# Patient Record
Sex: Male | Born: 1985 | Race: White | Hispanic: No | Marital: Married | Smoking: Current every day smoker
Health system: Southern US, Community
[De-identification: ages and names within clinical notes are randomized; demographics above are authoritative.]

## PROBLEM LIST (undated history)

## (undated) HISTORY — PX: WISDOM TOOTH EXTRACTION: SHX21

---
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
## 2005-03-28 ENCOUNTER — Emergency Department: Payer: Self-pay | Admitting: Emergency Medicine

## 2006-08-22 ENCOUNTER — Emergency Department: Payer: Self-pay | Admitting: Emergency Medicine

## 2011-07-23 ENCOUNTER — Emergency Department: Payer: Self-pay | Admitting: Emergency Medicine

## 2014-05-09 ENCOUNTER — Emergency Department: Payer: Self-pay | Admitting: Emergency Medicine

## 2017-10-01 ENCOUNTER — Emergency Department: Payer: Self-pay

## 2017-10-01 ENCOUNTER — Emergency Department
Admission: EM | Admit: 2017-10-01 | Discharge: 2017-10-01 | Disposition: A | Discharge status: Home / Self Care | Payer: Self-pay | Attending: Emergency Medicine | Admitting: Emergency Medicine

## 2017-10-01 DIAGNOSIS — R1031 Right lower quadrant pain: Secondary | ICD-10-CM | POA: Insufficient documentation

## 2017-10-01 DIAGNOSIS — F17200 Nicotine dependence, unspecified, uncomplicated: Secondary | ICD-10-CM | POA: Insufficient documentation

## 2017-10-01 LAB — CBC
HEMATOCRIT: 46.2 % (ref 40.0–52.0)
HEMOGLOBIN: 16.3 g/dL (ref 13.0–18.0)
MCH: 31.1 pg (ref 26.0–34.0)
MCHC: 35.3 g/dL (ref 32.0–36.0)
MCV: 87.9 fL (ref 80.0–100.0)
Platelets: 285 10*3/uL (ref 150–440)
RBC: 5.26 MIL/uL (ref 4.40–5.90)
RDW: 12.9 % (ref 11.5–14.5)
WBC: 10.6 10*3/uL (ref 3.8–10.6)

## 2017-10-01 LAB — COMPREHENSIVE METABOLIC PANEL
ALBUMIN: 4.3 g/dL (ref 3.5–5.0)
ALT: 30 U/L (ref 17–63)
AST: 36 U/L (ref 15–41)
Alkaline Phosphatase: 100 U/L (ref 38–126)
Anion gap: 11 (ref 5–15)
BUN: 14 mg/dL (ref 6–20)
CHLORIDE: 101 mmol/L (ref 101–111)
CO2: 26 mmol/L (ref 22–32)
CREATININE: 1.17 mg/dL (ref 0.61–1.24)
Calcium: 9.3 mg/dL (ref 8.9–10.3)
GFR calc Af Amer: >60 mL/min (ref >60)
GLUCOSE: 70 mg/dL (ref 65–99)
POTASSIUM: 4.2 mmol/L (ref 3.5–5.1)
Sodium: 138 mmol/L (ref 135–145)
Total Bilirubin: 0.4 mg/dL (ref 0.3–1.2)
Total Protein: 7.6 g/dL (ref 6.5–8.1)

## 2017-10-01 LAB — URINALYSIS, COMPLETE (UACMP) WITH MICROSCOPIC
BACTERIA UA: NONE SEEN
BILIRUBIN URINE: NEGATIVE
Glucose, UA: NEGATIVE mg/dL
KETONES UR: NEGATIVE mg/dL
LEUKOCYTES UA: NEGATIVE
NITRITE: NEGATIVE
PH: 5 (ref 5.0–8.0)
Protein, ur: NEGATIVE mg/dL
SPECIFIC GRAVITY, URINE: 1.015 (ref 1.005–1.030)
Squamous Epithelial / LPF: NONE SEEN

## 2017-10-01 LAB — LIPASE, BLOOD: LIPASE: 40 U/L (ref 11–51)

## 2017-10-01 MED ORDER — SODIUM CHLORIDE 0.9 % IV BOLUS (SEPSIS)
1000.0000 mL | Freq: Once | INTRAVENOUS | Status: AC
  Administered 2017-10-01: 1000 mL via INTRAVENOUS

## 2017-10-01 MED ORDER — IOPAMIDOL (ISOVUE-300) INJECTION 61%
30.0000 mL | Freq: Once | INTRAVENOUS | Status: AC | PRN
  Administered 2017-10-01: 30 mL via ORAL

## 2017-10-01 MED ORDER — IOPAMIDOL (ISOVUE-300) INJECTION 61%
100.0000 mL | Freq: Once | INTRAVENOUS | Status: AC | PRN
  Administered 2017-10-01: 100 mL via INTRAVENOUS

## 2017-10-01 NOTE — ED Triage Notes (Signed)
Patient c/o lower right abdominal pain beginning yesterday. Patient states: "I feel like there's a knot in my stomach". Patient reports he was lifting heavy objects yesterday. Patient points to right inguinal area for area of pain.

## 2017-10-01 NOTE — ED Notes (Signed)
Back in room s/p CT scan.

## 2017-10-01 NOTE — ED Provider Notes (Signed)
[facility] Emergency Department Provider Note  ____________________________________________   First MD Initiated Contact with Patient 10/01/17 2038     (approximate)  I have reviewed the triage vital signs and the nursing notes.   HISTORY  Chief Complaint Abdominal Pain   HPI Brian Moyer is a 31 y.o. male without any chronic medical problems was complaining of approximately 5 hours of right lower quadrant abdominal pain.  He says the pain is a 7 out of 10 and cramping in quality.  Is worse with movement.  He says that he lifts heavily at work but has done the same job over the past 2 years.  He has not increasing his lifting or activity recently.  He denies any nausea vomiting or diarrhea.  Denies any drinking or drug use.  Denies having an appendectomy.  Does not report any radiation of the pain.  Says that he feels "I am not" in the right lower quadrant region of the pain.    History reviewed. No pertinent past medical history.  There are no active problems to display for this patient.   Past Surgical History:  Procedure Laterality Date  . WISDOM TOOTH EXTRACTION      Prior to Admission medications   Not on File    Allergies Patient has no known allergies.  No family history on file.  Social History Social History   Tobacco Use  . Smoking status: Current Every Day Smoker  . Smokeless tobacco: Never Used  Substance Use Topics  . Alcohol use: Yes    Comment: occassional  . Drug use: No    Review of Systems  Constitutional: No fever/chills Eyes: No visual changes. ENT: No sore throat. Cardiovascular: Denies chest pain. Respiratory: Denies shortness of breath. Gastrointestinal:  No nausea, no vomiting.  No diarrhea.  No constipation. Genitourinary: Negative for dysuria. Musculoskeletal: Negative for back pain. Skin: Negative for rash. Neurological: Negative for headaches, focal weakness or  numbness.   ____________________________________________   PHYSICAL EXAM:  VITAL SIGNS: ED Triage Vitals [10/01/17 2013]  Enc Vitals Group     BP (!) 151/83     Pulse Rate 97     Resp 18     Temp 98.3 F (36.8 C)     Temp Source Oral     SpO2 99 %     Weight 220 lb (99.8 kg)     Height 6\' 1"  (1.854 m)     Head Circumference      Peak Flow      Pain Score 7     Pain Loc      Pain Edu?      Excl. in GC?     Constitutional: Alert and oriented. Well appearing and in no acute distress. Eyes: Conjunctivae are normal.  Head: Atraumatic. Nose: No congestion/rhinnorhea. Mouth/Throat: Mucous membranes are moist.  Neck: No stridor.   Cardiovascular: Normal rate, regular rhythm. Grossly normal heart sounds.   Respiratory: Normal respiratory effort.  No retractions. Lungs CTAB. Gastrointestinal: Soft with moderate right lower quadrant tenderness to palpation over McBurney's point without rebound or guarding.  There is no hernia mass palpated neither when the patient is lying down or when he is standing. No distention.  Musculoskeletal: No lower extremity tenderness nor edema.  No joint effusions. Neurologic:  Normal speech and language. No gross focal neurologic deficits are appreciated. Skin:  Skin is warm, dry and intact. No rash noted. Psychiatric: Mood and affect are normal. Speech and behavior are normal.  ____________________________________________   LABS (all labs ordered are listed, but only abnormal results are displayed)  Labs Reviewed  URINALYSIS, COMPLETE (UACMP) WITH MICROSCOPIC - Abnormal; Notable for the following components:      Result Value   Color, Urine YELLOW (*)    APPearance CLEAR (*)    Hgb urine dipstick SMALL (*)    All other components within normal limits  LIPASE, BLOOD  COMPREHENSIVE METABOLIC PANEL  CBC   ____________________________________________  EKG  Thickened appearance of the distal colon likely chronic.  Mild colitis not entirely  excluded.  No bowel obstruction and a normal appendix found.  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 ____________________________________________  RADIOLOGY   ____________________________________________   PROCEDURES  Procedure(s) performed:   Procedures  Critical Care performed:   ____________________________________________   INITIAL IMPRESSION / ASSESSMENT AND PLAN / ED COURSE  Pertinent labs & imaging results that were available during my care of the patient were reviewed by me and considered in my medical decision making (see chart for details).  Differential diagnosis includes, but is not limited to, acute appendicitis, renal colic, testicular torsion, urinary tract infection/pyelonephritis, prostatitis,  epididymitis, diverticulitis, small bowel obstruction or ileus, colitis, abdominal aortic aneurysm, gastroenteritis, hernia, etc.  As part of my medical decision making, I reviewed the following data within the electronic MEDICAL RECORD NUMBER Old chart reviewed     ----------------------------------------- 11:20 PM on 10/01/2017 -----------------------------------------  Patient at this time continues without any nausea vomiting or diarrhea.  No worsening of his pain.  Discussed the CAT scan results with the patient.  He will be discharged to follow-up with primary care at this time.  Discussed return precautions such as any worsening or concerning symptoms.  However, I feel that this is most likely related to abdominal wall pain given the patient's lab results as well as imaging.  Patient will return for any worsening or concerning symptoms especially worsening pain, nausea vomiting, diarrhea or fever.  Otherwise, he will try topical salve such as Aspercreme or icy hot and ibuprofen or Advil p.o.  He is understanding of this plan willing to  comply.  ____________________________________________   FINAL CLINICAL IMPRESSION(S) / ED DIAGNOSES  Right lower quadrant abdominal pain.    NEW MEDICATIONS STARTED DURING THIS VISIT:  This SmartLink is deprecated. Use AVSMEDLIST instead to display the medication list for a patient.   Note:  This document was prepared using Dragon voice recognition software and may include unintentional dictation errors.     Myrna BlazerSchaevitz, Arul Farabee Matthew, MD 10/01/17 [id number]

## 2017-10-01 NOTE — ED Notes (Signed)
Reviewed discharge instructions, and follow-up care with patient. Patient verbalized understanding of all information reviewed. Patient stable, with no distress noted at this time.    

## 2017-10-01 NOTE — ED Notes (Signed)
Patient transported to CT 

## 2018-03-11 ENCOUNTER — Encounter: Payer: Self-pay | Admitting: Emergency Medicine

## 2018-03-11 ENCOUNTER — Emergency Department
Admission: EM | Admit: 2018-03-11 | Discharge: 2018-03-11 | Disposition: A | Discharge status: Home / Self Care | Payer: Commercial Managed Care - PPO | Attending: Emergency Medicine | Admitting: Emergency Medicine

## 2018-03-11 ENCOUNTER — Emergency Department: Payer: Commercial Managed Care - PPO

## 2018-03-11 DIAGNOSIS — S93402A Sprain of unspecified ligament of left ankle, initial encounter: Secondary | ICD-10-CM | POA: Diagnosis not present

## 2018-03-11 DIAGNOSIS — Y9364 Activity, baseball: Secondary | ICD-10-CM | POA: Insufficient documentation

## 2018-03-11 DIAGNOSIS — X509XXA Other and unspecified overexertion or strenuous movements or postures, initial encounter: Secondary | ICD-10-CM | POA: Diagnosis not present

## 2018-03-11 DIAGNOSIS — Y92320 Baseball field as the place of occurrence of the external cause: Secondary | ICD-10-CM | POA: Insufficient documentation

## 2018-03-11 DIAGNOSIS — S99912A Unspecified injury of left ankle, initial encounter: Secondary | ICD-10-CM | POA: Diagnosis present

## 2018-03-11 DIAGNOSIS — Y998 Other external cause status: Secondary | ICD-10-CM | POA: Insufficient documentation

## 2018-03-11 DIAGNOSIS — F17200 Nicotine dependence, unspecified, uncomplicated: Secondary | ICD-10-CM | POA: Insufficient documentation

## 2018-03-11 MED ORDER — IBUPROFEN 800 MG PO TABS: 800.0000 mg | ORAL_TABLET | Freq: Three times a day (TID) | ORAL | 0 refills | Status: AC | PRN

## 2018-03-11 MED ORDER — OXYCODONE-ACETAMINOPHEN 5-325 MG PO TABS
1.0000 | ORAL_TABLET | Freq: Once | ORAL | Status: AC
  Administered 2018-03-11: 1 via ORAL
  Filled 2018-03-11: qty 1

## 2018-03-11 MED ORDER — OXYCODONE-ACETAMINOPHEN 5-325 MG PO TABS: 1.0000 | ORAL_TABLET | ORAL | 0 refills | Status: AC | PRN

## 2018-03-11 NOTE — ED Triage Notes (Signed)
Patient presents to the ED with left ankle pain since last night.  Patient was playing softball and slid.  Patient is in no obvious distress at this time.  Patient states he can put very little weight on left ankle.  Ankle appears swollen.

## 2018-03-11 NOTE — ED Notes (Signed)

## 2018-03-11 NOTE — ED Provider Notes (Signed)
[facility] Emergency Department Provider Note  ____________________________________________  Time seen: Approximately 5:41 PM  I have reviewed the triage vital signs and the nursing notes.   HISTORY  Chief Complaint Ankle Pain    HPI Brian Moyer is a 32 y.o. male that presents to emergency department for evaluation of left ankle pain after injury during softball last night.  Patient collided with another player.  His ankle twisted inward.  He has had difficulty walking since last night.  He has taken ibuprofen for pain and swelling.  No numbness, tingling.   History reviewed. No pertinent past medical history.  There are no active problems to display for this patient.   Past Surgical History:  Procedure Laterality Date  . WISDOM TOOTH EXTRACTION      Prior to Admission medications   Medication Sig Start Date End Date Taking? Authorizing Provider  ibuprofen (ADVIL,MOTRIN) 800 MG tablet Take 1 tablet (800 mg total) by mouth every 8 (eight) hours as needed. 03/11/18   Enid Derry, PA-C  oxyCODONE-acetaminophen (PERCOCET) 5-325 MG tablet Take 1 tablet by mouth every 4 (four) hours as needed for severe pain. 03/11/18 03/11/19  Enid Derry, PA-C    Allergies Patient has no known allergies.  No family history on file.  Social History Social History   Tobacco Use  . Smoking status: Current Every Day Smoker  . Smokeless tobacco: Never Used  Substance Use Topics  . Alcohol use: Yes    Comment: occassional  . Drug use: No     Review of Systems  Cardiovascular: No chest pain. Respiratory: No SOB. Gastrointestinal: No nausea, no vomiting.  Musculoskeletal: Positive for ankle pain. Skin: Negative for rash, abrasions, lacerations.  Positive for ecchymosis. Neurological: Negative for numbness or tingling   ____________________________________________   PHYSICAL EXAM:  VITAL SIGNS: ED Triage Vitals  Enc Vitals Group     BP 03/11/18 1649  (!) 133/92     Pulse Rate 03/11/18 1649 (!) 103     Resp 03/11/18 1649 16     Temp 03/11/18 1649 98.9 F (37.2 C)     Temp Source 03/11/18 1649 Oral     SpO2 03/11/18 1649 98 %     Weight 03/11/18 1649 220 lb (99.8 kg)     Height 03/11/18 1649  (1.854 m)     Head Circumference --      Peak Flow --      Pain Score 03/11/18 1657 10     Pain Loc --      Pain Edu? --      Excl. in GC? --      Constitutional: Alert and oriented. Well appearing and in no acute distress. Eyes: Conjunctivae are normal. PERRL. EOMI. Head: Atraumatic. ENT:      Ears:      Nose: No congestion/rhinnorhea.      Mouth/Throat: Mucous membranes are moist.  Neck: No stridor.   Cardiovascular: Normal rate, regular rhythm.  Good peripheral circulation.  Symmetric dorsalis pedis pulses bilaterally. Respiratory: Normal respiratory effort without tachypnea or retractions. Lungs CTAB. Good air entry to the bases with no decreased or absent breath sounds. Musculoskeletal:  No gross deformities appreciated.  Moderate swelling and ecchymosis to lateral left malleolus. Neurologic:  Normal speech and language. No gross focal neurologic deficits are appreciated.  Skin:  Skin is warm, dry and intact. No rash noted. Psychiatric: Mood and affect are normal. Speech and behavior are normal. Patient exhibits appropriate insight and judgement.  ____________________________________________   LABS (all labs ordered are listed, but only abnormal results are displayed)  Labs Reviewed - No data to display ____________________________________________  EKG   ____________________________________________  RADIOLOGY Lexine Baton, personally viewed and evaluated these images (plain radiographs) as part of my medical decision making, as well as reviewing the written report by the radiologist.  Dg Ankle Complete Left  Result Date: 03/11/2018 CLINICAL DATA:  Pain, trauma, injury playing softball EXAM: LEFT ANKLE COMPLETE  - 3+ VIEW COMPARISON:  None. FINDINGS: Lateral soft tissue swelling. No fracture, subluxation or dislocation. Soft tissues are intact. IMPRESSION: Lateral soft tissue swelling.  No acute bony abnormality. Electronically Signed   By: Charlett Nose M.D.   On: 03/11/2018 17:21    ____________________________________________    PROCEDURES  Procedure(s) performed:    Procedures    Medications  oxyCODONE-acetaminophen (PERCOCET/ROXICET) 5-325 MG per tablet 1 tablet (1 tablet Oral Given 03/11/18 1756)     ____________________________________________   INITIAL IMPRESSION / ASSESSMENT AND PLAN / ED COURSE  Pertinent labs & imaging results that were available during my care of the patient were reviewed by me and considered in my medical decision making (see chart for details).  Review of the [place] CSRS was performed in accordance of the [facility] prior to dispensing any controlled drugs.     Patient's diagnosis is consistent with ankle sprain.  Vital signs and exam are reassuring.  X-ray negative for fracture.  Ankle splint was placed.  Crutches were given.  Patient will be discharged home with prescriptions for Percocet and ibuprofen. Patient is to follow up with orthopedics as directed. Patient is given ED precautions to return to the ED for any worsening or new symptoms.     ____________________________________________  FINAL CLINICAL IMPRESSION(S) / ED DIAGNOSES  Final diagnoses:  Sprain of left ankle, unspecified ligament, initial encounter      NEW MEDICATIONS STARTED DURING THIS VISIT:  ED Discharge Orders        Ordered    oxyCODONE-acetaminophen (PERCOCET) 5-325 MG tablet  Every 4 hours PRN     03/11/18 1808    ibuprofen (ADVIL,MOTRIN) 800 MG tablet  Every 8 hours PRN     03/11/18 1808          This chart was dictated using voice recognition software/Dragon. Despite best efforts to proofread, errors can occur which can change the meaning. Any change was purely  unintentional.    Enid Derry, PA-C 03/11/18 1823    Dionne Bucy, MD 03/11/18 [phone]

## 2018-12-06 IMAGING — CT CT ABD-PELV W/ CM
2 of 4 series · 16 of 46 positions shown, 18 images · IV contrast (APPLIED)
Comparison: None.

CLINICAL DATA: 31-year-old male with abdominal pain. Concern for
acute appendicitis.

EXAM:
CT ABDOMEN AND PELVIS WITH CONTRAST
TECHNIQUE: Multidetector CT imaging of the abdomen and pelvis was performed
using the standard protocol following bolus administration of
intravenous contrast.
CONTRAST:  100mL B9IM2T-I33 IOPAMIDOL (B9IM2T-I33) INJECTION 61%

[Series 2: routine abd/pel with · axial · 0.87mm/px · z∈[-964,-484]mm · 13 of 106 slices shown, 15 images]
[im 5/106  soft-tissue]
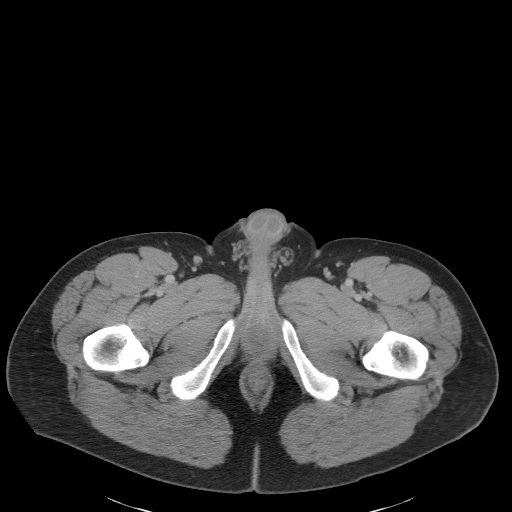
[im 5/106  bone]
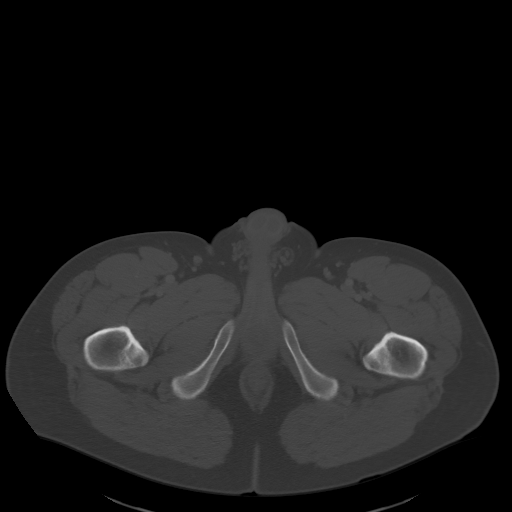
[im 14/106  soft-tissue]
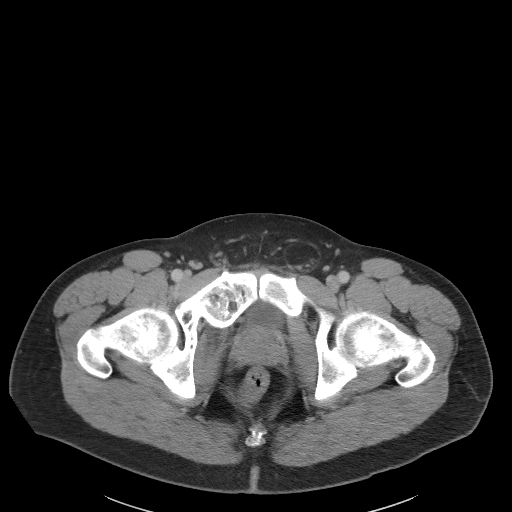
[im 23/106  soft-tissue]
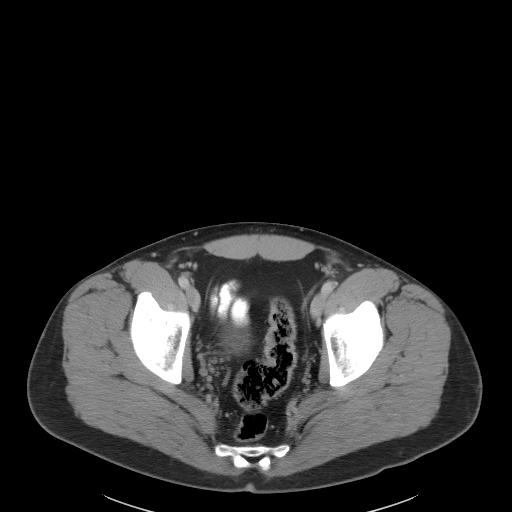
[im 28/106  soft-tissue]
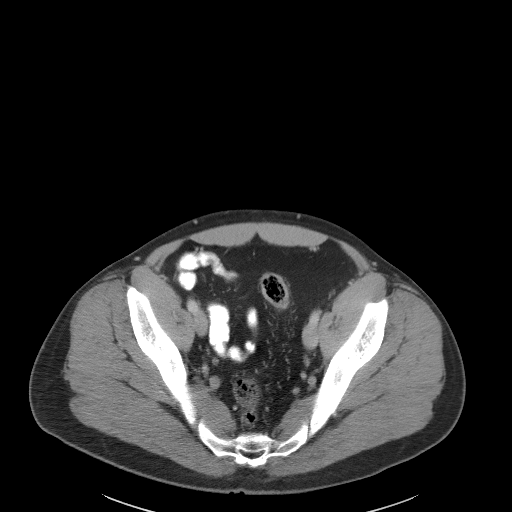
[im 37/106  soft-tissue]
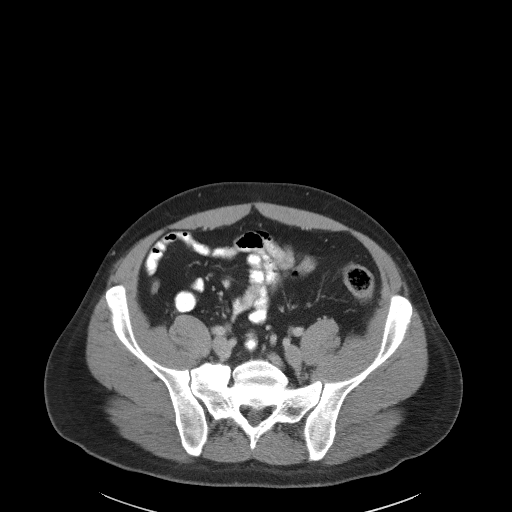
[im 46/106  soft-tissue]
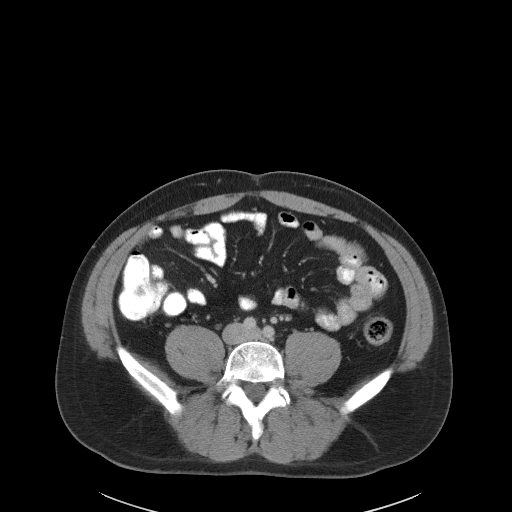
[im 55/106  soft-tissue]
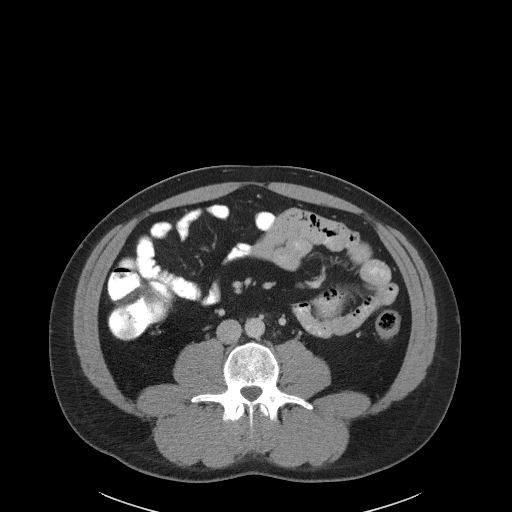
[im 60/106  soft-tissue]
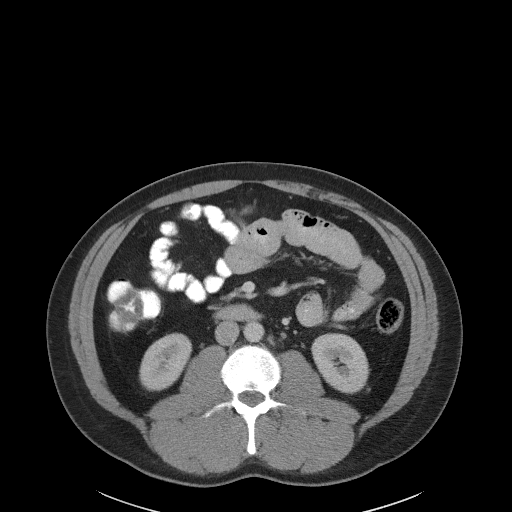
[im 69/106  soft-tissue]
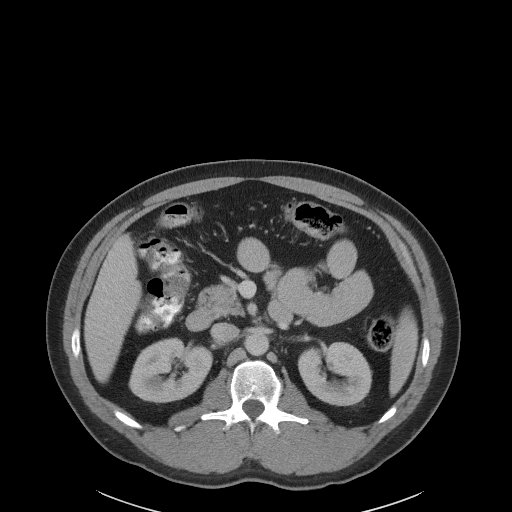
[im 69/106  bone]
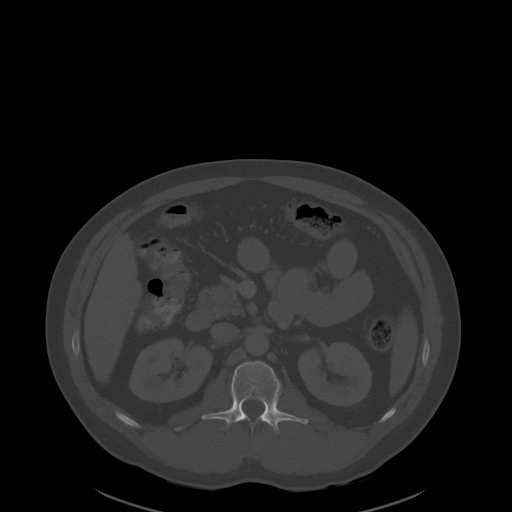
[im 78/106  soft-tissue]
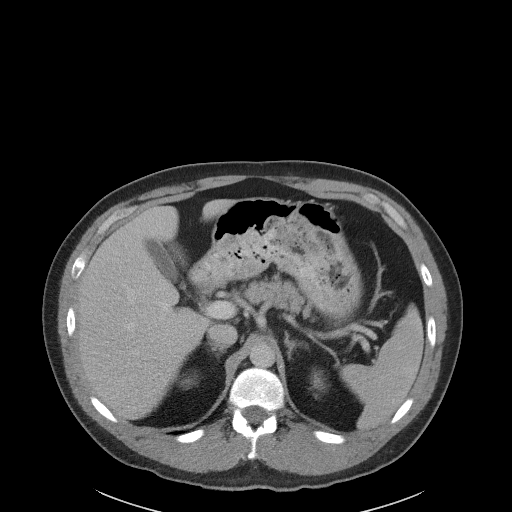
[im 83/106  soft-tissue]
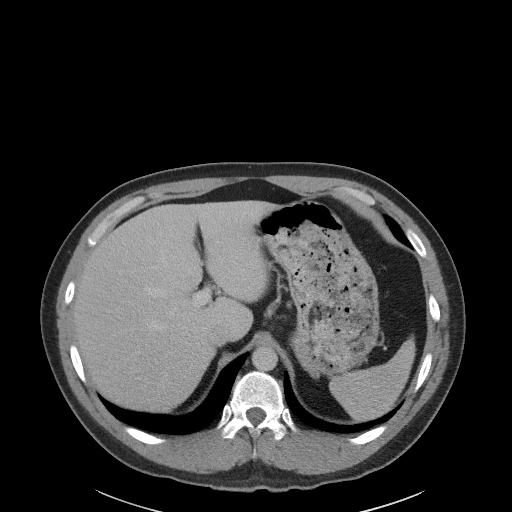
[im 92/106  soft-tissue]
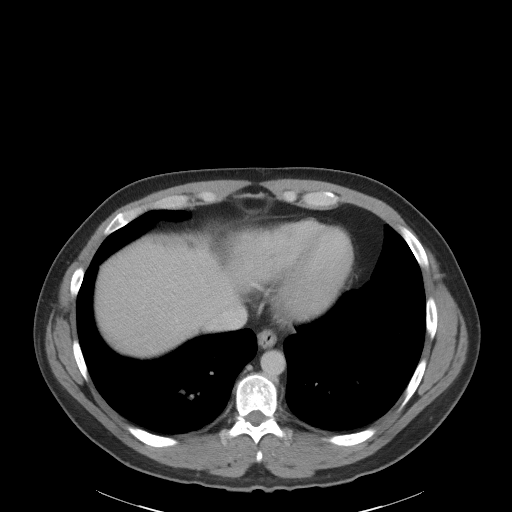
[im 101/106  soft-tissue]
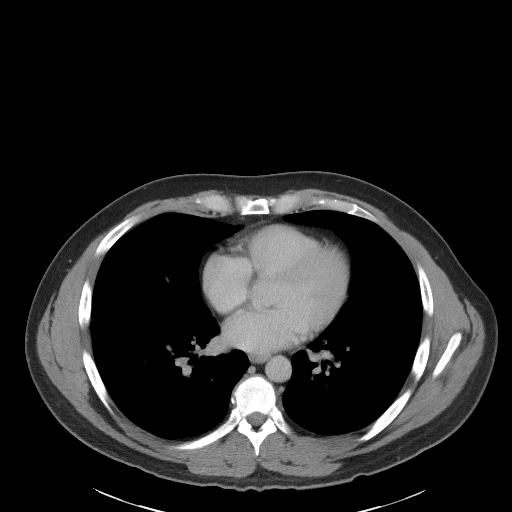

[Series 5: coronal st · coronal · 0.84mm/px · 3 of 94 slices shown]
[im 32/94  soft-tissue]
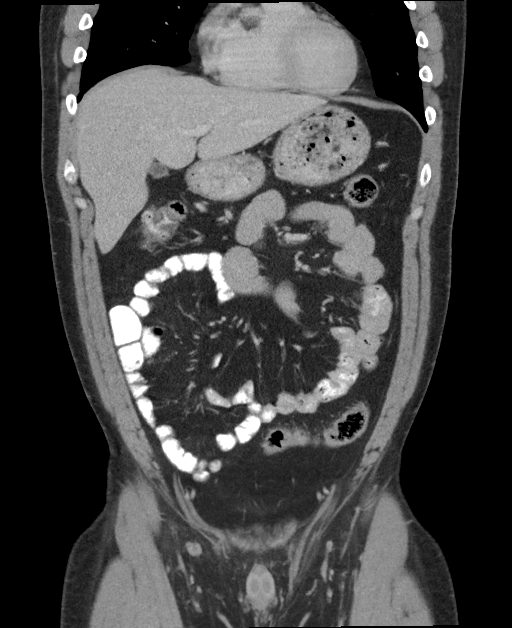
[im 42/94  soft-tissue]
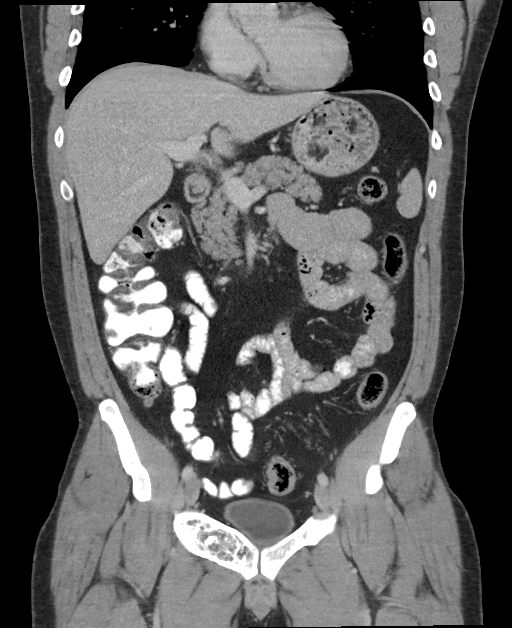
[im 52/94  soft-tissue]
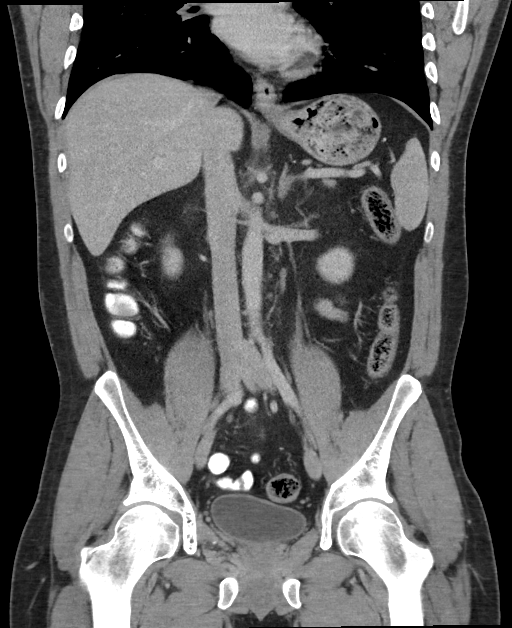

[16 of 46 positions shown; findings below may reference images not displayed]

FINDINGS: Lower chest:  The visualized lung bases are clear.

No intra-abdominal free air or free fluid.

Hepatobiliary: No focal liver abnormality is seen. No gallstones,
gallbladder wall thickening, or biliary dilatation.

Pancreas: Unremarkable. No pancreatic ductal dilatation or
surrounding inflammatory changes.

Spleen: Normal in size without focal abnormality.

Adrenals/Urinary Tract: Adrenal glands are unremarkable. Kidneys are
normal, without renal calculi, focal lesion, or hydronephrosis.
Bladder is unremarkable.

Stomach/Bowel: There is mild thickened appearance of the stop distal
transverse, descending, and sigmoid colon which may be related to
underdistention or chronic changes. Mild acute colitis is less
likely but not entirely excluded. Clinical correlation is
recommended. There is no bowel obstruction. Normal appendix.

Vascular/Lymphatic: No significant vascular findings are present. No
enlarged abdominal or pelvic lymph nodes.

Reproductive: The prostate and seminal vesicles are grossly
unremarkable

Other: Small fat containing left inguinal and umbilical hernias.

Musculoskeletal: There is heterogeneous and expansile changes of the
right superior pubic ramus with differential considerations
including Paget's disease versus fibrous dysplasia. No acute osseous
pathology.
IMPRESSION: 1. Thickened appearance of the distal colon, likely chronic. Mild
acute colitis is not entirely excluded. Clinical correlation is
recommended. No bowel obstruction. Normal appendix.
2. Heterogeneous and expansile changes of the right superior pubic
ramus consistent with Paget's versus fibrous dysplasia.
